# Patient Record
Sex: Female | Born: 1980 | Race: White | Hispanic: No | Marital: Married | Smoking: Former smoker
Health system: Southern US, Community
[De-identification: ages and names within clinical notes are randomized; demographics above are authoritative.]

## PROBLEM LIST (undated history)

## (undated) HISTORY — PX: ABLATION: SHX5711

---

## 2016-01-27 DIAGNOSIS — N393 Stress incontinence (female) (male): Secondary | ICD-10-CM | POA: Diagnosis not present

## 2016-01-27 DIAGNOSIS — N3000 Acute cystitis without hematuria: Secondary | ICD-10-CM | POA: Diagnosis not present

## 2016-01-27 DIAGNOSIS — N39492 Postural (urinary) incontinence: Secondary | ICD-10-CM | POA: Diagnosis not present

## 2016-01-27 DIAGNOSIS — N3941 Urge incontinence: Secondary | ICD-10-CM | POA: Diagnosis not present

## 2016-02-05 DIAGNOSIS — F418 Other specified anxiety disorders: Secondary | ICD-10-CM | POA: Diagnosis not present

## 2016-02-05 DIAGNOSIS — R05 Cough: Secondary | ICD-10-CM | POA: Diagnosis not present

## 2016-02-12 DIAGNOSIS — N39492 Postural (urinary) incontinence: Secondary | ICD-10-CM | POA: Diagnosis not present

## 2016-02-12 DIAGNOSIS — N393 Stress incontinence (female) (male): Secondary | ICD-10-CM | POA: Diagnosis not present

## 2016-02-12 DIAGNOSIS — N318 Other neuromuscular dysfunction of bladder: Secondary | ICD-10-CM | POA: Diagnosis not present

## 2016-02-12 DIAGNOSIS — N3020 Other chronic cystitis without hematuria: Secondary | ICD-10-CM | POA: Diagnosis not present

## 2016-02-12 DIAGNOSIS — N3941 Urge incontinence: Secondary | ICD-10-CM | POA: Diagnosis not present

## 2016-04-13 DIAGNOSIS — Z713 Dietary counseling and surveillance: Secondary | ICD-10-CM | POA: Diagnosis not present

## 2016-05-05 DIAGNOSIS — F418 Other specified anxiety disorders: Secondary | ICD-10-CM | POA: Diagnosis not present

## 2016-05-28 DIAGNOSIS — Z23 Encounter for immunization: Secondary | ICD-10-CM | POA: Diagnosis not present

## 2016-06-07 DIAGNOSIS — R6889 Other general symptoms and signs: Secondary | ICD-10-CM | POA: Diagnosis not present

## 2016-06-07 DIAGNOSIS — F319 Bipolar disorder, unspecified: Secondary | ICD-10-CM | POA: Diagnosis not present

## 2016-06-07 DIAGNOSIS — B354 Tinea corporis: Secondary | ICD-10-CM | POA: Diagnosis not present

## 2016-06-07 DIAGNOSIS — M25561 Pain in right knee: Secondary | ICD-10-CM | POA: Diagnosis not present

## 2016-07-06 DIAGNOSIS — F319 Bipolar disorder, unspecified: Secondary | ICD-10-CM | POA: Diagnosis not present

## 2016-07-06 DIAGNOSIS — Z720 Tobacco use: Secondary | ICD-10-CM | POA: Diagnosis not present

## 2016-09-10 DIAGNOSIS — Z1389 Encounter for screening for other disorder: Secondary | ICD-10-CM | POA: Diagnosis not present

## 2016-09-10 DIAGNOSIS — H6092 Unspecified otitis externa, left ear: Secondary | ICD-10-CM | POA: Diagnosis not present

## 2016-09-10 DIAGNOSIS — Z0000 Encounter for general adult medical examination without abnormal findings: Secondary | ICD-10-CM | POA: Diagnosis not present

## 2016-12-01 DIAGNOSIS — N3090 Cystitis, unspecified without hematuria: Secondary | ICD-10-CM | POA: Diagnosis not present

## 2016-12-01 DIAGNOSIS — R300 Dysuria: Secondary | ICD-10-CM | POA: Diagnosis not present

## 2017-01-17 DIAGNOSIS — R1011 Right upper quadrant pain: Secondary | ICD-10-CM | POA: Diagnosis not present

## 2017-01-17 DIAGNOSIS — R112 Nausea with vomiting, unspecified: Secondary | ICD-10-CM | POA: Diagnosis not present

## 2017-01-17 DIAGNOSIS — K2900 Acute gastritis without bleeding: Secondary | ICD-10-CM | POA: Diagnosis not present

## 2017-01-17 DIAGNOSIS — K2970 Gastritis, unspecified, without bleeding: Secondary | ICD-10-CM | POA: Diagnosis not present

## 2017-02-03 DIAGNOSIS — Z6835 Body mass index (BMI) 35.0-35.9, adult: Secondary | ICD-10-CM | POA: Diagnosis not present

## 2017-02-03 DIAGNOSIS — Z01419 Encounter for gynecological examination (general) (routine) without abnormal findings: Secondary | ICD-10-CM | POA: Diagnosis not present

## 2017-02-06 DIAGNOSIS — R51 Headache: Secondary | ICD-10-CM | POA: Diagnosis not present

## 2017-02-06 DIAGNOSIS — Z6835 Body mass index (BMI) 35.0-35.9, adult: Secondary | ICD-10-CM | POA: Diagnosis not present

## 2017-02-06 DIAGNOSIS — H53149 Visual discomfort, unspecified: Secondary | ICD-10-CM | POA: Diagnosis not present

## 2017-02-06 DIAGNOSIS — F17210 Nicotine dependence, cigarettes, uncomplicated: Secondary | ICD-10-CM | POA: Diagnosis not present

## 2017-02-06 DIAGNOSIS — R112 Nausea with vomiting, unspecified: Secondary | ICD-10-CM | POA: Diagnosis not present

## 2017-02-06 DIAGNOSIS — F419 Anxiety disorder, unspecified: Secondary | ICD-10-CM | POA: Diagnosis not present

## 2017-02-07 DIAGNOSIS — R51 Headache: Secondary | ICD-10-CM | POA: Diagnosis not present

## 2017-02-18 DIAGNOSIS — H60391 Other infective otitis externa, right ear: Secondary | ICD-10-CM | POA: Diagnosis not present

## 2017-02-23 DIAGNOSIS — N941 Unspecified dyspareunia: Secondary | ICD-10-CM | POA: Diagnosis not present

## 2017-02-23 DIAGNOSIS — N946 Dysmenorrhea, unspecified: Secondary | ICD-10-CM | POA: Diagnosis not present

## 2017-02-23 DIAGNOSIS — N924 Excessive bleeding in the premenopausal period: Secondary | ICD-10-CM | POA: Diagnosis not present

## 2017-02-23 DIAGNOSIS — N921 Excessive and frequent menstruation with irregular cycle: Secondary | ICD-10-CM | POA: Diagnosis not present

## 2017-05-03 DIAGNOSIS — N921 Excessive and frequent menstruation with irregular cycle: Secondary | ICD-10-CM | POA: Diagnosis not present

## 2017-05-03 DIAGNOSIS — N809 Endometriosis, unspecified: Secondary | ICD-10-CM | POA: Diagnosis not present

## 2017-05-05 DIAGNOSIS — N800 Endometriosis of uterus: Secondary | ICD-10-CM | POA: Diagnosis not present

## 2017-05-05 DIAGNOSIS — N920 Excessive and frequent menstruation with regular cycle: Secondary | ICD-10-CM | POA: Diagnosis not present

## 2017-05-10 DIAGNOSIS — G4710 Hypersomnia, unspecified: Secondary | ICD-10-CM | POA: Diagnosis not present

## 2017-05-10 DIAGNOSIS — F319 Bipolar disorder, unspecified: Secondary | ICD-10-CM | POA: Diagnosis not present

## 2017-05-10 DIAGNOSIS — Z6836 Body mass index (BMI) 36.0-36.9, adult: Secondary | ICD-10-CM | POA: Diagnosis not present

## 2017-05-20 DIAGNOSIS — Z23 Encounter for immunization: Secondary | ICD-10-CM | POA: Diagnosis not present

## 2017-05-23 DIAGNOSIS — Z09 Encounter for follow-up examination after completed treatment for conditions other than malignant neoplasm: Secondary | ICD-10-CM | POA: Diagnosis not present

## 2017-06-24 DIAGNOSIS — G4710 Hypersomnia, unspecified: Secondary | ICD-10-CM | POA: Diagnosis not present

## 2017-06-24 DIAGNOSIS — G4733 Obstructive sleep apnea (adult) (pediatric): Secondary | ICD-10-CM | POA: Diagnosis not present

## 2017-06-28 DIAGNOSIS — G4710 Hypersomnia, unspecified: Secondary | ICD-10-CM | POA: Diagnosis not present

## 2017-07-11 DIAGNOSIS — N3090 Cystitis, unspecified without hematuria: Secondary | ICD-10-CM | POA: Diagnosis not present

## 2017-07-11 DIAGNOSIS — R300 Dysuria: Secondary | ICD-10-CM | POA: Diagnosis not present

## 2017-07-24 DIAGNOSIS — G4733 Obstructive sleep apnea (adult) (pediatric): Secondary | ICD-10-CM | POA: Diagnosis not present

## 2017-07-24 DIAGNOSIS — G4710 Hypersomnia, unspecified: Secondary | ICD-10-CM | POA: Diagnosis not present

## 2017-08-10 DIAGNOSIS — J069 Acute upper respiratory infection, unspecified: Secondary | ICD-10-CM | POA: Diagnosis not present

## 2017-08-24 DIAGNOSIS — G4733 Obstructive sleep apnea (adult) (pediatric): Secondary | ICD-10-CM | POA: Diagnosis not present

## 2017-08-24 DIAGNOSIS — G4710 Hypersomnia, unspecified: Secondary | ICD-10-CM | POA: Diagnosis not present

## 2017-09-24 DIAGNOSIS — G4733 Obstructive sleep apnea (adult) (pediatric): Secondary | ICD-10-CM | POA: Diagnosis not present

## 2017-09-24 DIAGNOSIS — G4710 Hypersomnia, unspecified: Secondary | ICD-10-CM | POA: Diagnosis not present

## 2017-10-20 DIAGNOSIS — J209 Acute bronchitis, unspecified: Secondary | ICD-10-CM | POA: Diagnosis not present

## 2017-11-11 DIAGNOSIS — L409 Psoriasis, unspecified: Secondary | ICD-10-CM | POA: Diagnosis not present

## 2017-11-23 DIAGNOSIS — Z0000 Encounter for general adult medical examination without abnormal findings: Secondary | ICD-10-CM | POA: Diagnosis not present

## 2017-12-12 DIAGNOSIS — R7301 Impaired fasting glucose: Secondary | ICD-10-CM | POA: Diagnosis not present

## 2018-01-03 DIAGNOSIS — B360 Pityriasis versicolor: Secondary | ICD-10-CM | POA: Diagnosis not present

## 2018-01-03 DIAGNOSIS — L299 Pruritus, unspecified: Secondary | ICD-10-CM | POA: Diagnosis not present

## 2018-01-03 DIAGNOSIS — L301 Dyshidrosis [pompholyx]: Secondary | ICD-10-CM | POA: Diagnosis not present

## 2018-02-14 DIAGNOSIS — Z6837 Body mass index (BMI) 37.0-37.9, adult: Secondary | ICD-10-CM | POA: Diagnosis not present

## 2018-02-14 DIAGNOSIS — Z01419 Encounter for gynecological examination (general) (routine) without abnormal findings: Secondary | ICD-10-CM | POA: Diagnosis not present

## 2018-03-13 DIAGNOSIS — N3090 Cystitis, unspecified without hematuria: Secondary | ICD-10-CM | POA: Diagnosis not present

## 2018-03-13 DIAGNOSIS — R300 Dysuria: Secondary | ICD-10-CM | POA: Diagnosis not present

## 2018-04-06 DIAGNOSIS — F419 Anxiety disorder, unspecified: Secondary | ICD-10-CM | POA: Diagnosis not present

## 2018-04-06 DIAGNOSIS — F319 Bipolar disorder, unspecified: Secondary | ICD-10-CM | POA: Diagnosis not present

## 2018-04-06 DIAGNOSIS — R002 Palpitations: Secondary | ICD-10-CM | POA: Diagnosis not present

## 2018-04-06 DIAGNOSIS — Z1339 Encounter for screening examination for other mental health and behavioral disorders: Secondary | ICD-10-CM | POA: Diagnosis not present

## 2018-05-17 DIAGNOSIS — Z23 Encounter for immunization: Secondary | ICD-10-CM | POA: Diagnosis not present

## 2018-05-22 DIAGNOSIS — R5381 Other malaise: Secondary | ICD-10-CM | POA: Diagnosis not present

## 2018-05-22 DIAGNOSIS — I10 Essential (primary) hypertension: Secondary | ICD-10-CM | POA: Diagnosis not present

## 2018-05-28 DIAGNOSIS — H6092 Unspecified otitis externa, left ear: Secondary | ICD-10-CM | POA: Diagnosis not present

## 2018-06-06 DIAGNOSIS — Z713 Dietary counseling and surveillance: Secondary | ICD-10-CM | POA: Diagnosis not present

## 2018-06-19 DIAGNOSIS — E6609 Other obesity due to excess calories: Secondary | ICD-10-CM | POA: Diagnosis not present

## 2018-07-28 DIAGNOSIS — E6609 Other obesity due to excess calories: Secondary | ICD-10-CM | POA: Diagnosis not present

## 2018-07-28 DIAGNOSIS — Z713 Dietary counseling and surveillance: Secondary | ICD-10-CM | POA: Diagnosis not present

## 2018-07-28 DIAGNOSIS — I10 Essential (primary) hypertension: Secondary | ICD-10-CM | POA: Diagnosis not present

## 2018-08-11 DIAGNOSIS — L301 Dyshidrosis [pompholyx]: Secondary | ICD-10-CM | POA: Diagnosis not present

## 2018-08-11 DIAGNOSIS — L219 Seborrheic dermatitis, unspecified: Secondary | ICD-10-CM | POA: Diagnosis not present

## 2018-08-28 DIAGNOSIS — I10 Essential (primary) hypertension: Secondary | ICD-10-CM | POA: Diagnosis not present

## 2018-09-25 DIAGNOSIS — Z713 Dietary counseling and surveillance: Secondary | ICD-10-CM | POA: Diagnosis not present

## 2018-10-24 DIAGNOSIS — R5382 Chronic fatigue, unspecified: Secondary | ICD-10-CM | POA: Diagnosis not present

## 2018-11-02 DIAGNOSIS — G43909 Migraine, unspecified, not intractable, without status migrainosus: Secondary | ICD-10-CM | POA: Diagnosis not present

## 2018-11-02 DIAGNOSIS — Z6832 Body mass index (BMI) 32.0-32.9, adult: Secondary | ICD-10-CM | POA: Diagnosis not present

## 2018-12-13 DIAGNOSIS — Z713 Dietary counseling and surveillance: Secondary | ICD-10-CM | POA: Diagnosis not present

## 2019-02-01 DIAGNOSIS — R5382 Chronic fatigue, unspecified: Secondary | ICD-10-CM | POA: Diagnosis not present

## 2019-02-22 DIAGNOSIS — Z87891 Personal history of nicotine dependence: Secondary | ICD-10-CM | POA: Diagnosis not present

## 2019-02-22 DIAGNOSIS — Z6832 Body mass index (BMI) 32.0-32.9, adult: Secondary | ICD-10-CM | POA: Diagnosis not present

## 2019-02-22 DIAGNOSIS — R110 Nausea: Secondary | ICD-10-CM | POA: Diagnosis not present

## 2019-02-22 DIAGNOSIS — R1011 Right upper quadrant pain: Secondary | ICD-10-CM | POA: Diagnosis not present

## 2019-02-22 DIAGNOSIS — R140 Abdominal distension (gaseous): Secondary | ICD-10-CM | POA: Diagnosis not present

## 2019-02-22 DIAGNOSIS — R109 Unspecified abdominal pain: Secondary | ICD-10-CM | POA: Diagnosis not present

## 2019-02-26 DIAGNOSIS — B373 Candidiasis of vulva and vagina: Secondary | ICD-10-CM | POA: Diagnosis not present

## 2019-02-26 DIAGNOSIS — Z713 Dietary counseling and surveillance: Secondary | ICD-10-CM | POA: Diagnosis not present

## 2019-02-26 DIAGNOSIS — Z6832 Body mass index (BMI) 32.0-32.9, adult: Secondary | ICD-10-CM | POA: Diagnosis not present

## 2019-02-26 DIAGNOSIS — Z01419 Encounter for gynecological examination (general) (routine) without abnormal findings: Secondary | ICD-10-CM | POA: Diagnosis not present

## 2019-03-08 DIAGNOSIS — R1011 Right upper quadrant pain: Secondary | ICD-10-CM | POA: Diagnosis not present

## 2019-03-26 DIAGNOSIS — R5382 Chronic fatigue, unspecified: Secondary | ICD-10-CM | POA: Diagnosis not present

## 2019-04-26 DIAGNOSIS — Z713 Dietary counseling and surveillance: Secondary | ICD-10-CM | POA: Diagnosis not present

## 2019-06-20 DIAGNOSIS — R5382 Chronic fatigue, unspecified: Secondary | ICD-10-CM | POA: Diagnosis not present

## 2019-06-20 DIAGNOSIS — Z23 Encounter for immunization: Secondary | ICD-10-CM | POA: Diagnosis not present

## 2019-07-07 DIAGNOSIS — N3091 Cystitis, unspecified with hematuria: Secondary | ICD-10-CM | POA: Diagnosis not present

## 2019-07-18 DIAGNOSIS — R5382 Chronic fatigue, unspecified: Secondary | ICD-10-CM | POA: Diagnosis not present

## 2020-01-09 DIAGNOSIS — F419 Anxiety disorder, unspecified: Secondary | ICD-10-CM | POA: Insufficient documentation

## 2020-01-09 DIAGNOSIS — E559 Vitamin D deficiency, unspecified: Secondary | ICD-10-CM | POA: Insufficient documentation

## 2020-01-09 DIAGNOSIS — E785 Hyperlipidemia, unspecified: Secondary | ICD-10-CM | POA: Insufficient documentation

## 2020-01-09 DIAGNOSIS — Z6832 Body mass index (BMI) 32.0-32.9, adult: Secondary | ICD-10-CM | POA: Insufficient documentation

## 2020-01-09 DIAGNOSIS — E538 Deficiency of other specified B group vitamins: Secondary | ICD-10-CM | POA: Insufficient documentation

## 2020-01-09 DIAGNOSIS — R7303 Prediabetes: Secondary | ICD-10-CM | POA: Insufficient documentation

## 2020-08-20 DIAGNOSIS — R209 Unspecified disturbances of skin sensation: Secondary | ICD-10-CM | POA: Diagnosis not present

## 2020-08-20 DIAGNOSIS — L659 Nonscarring hair loss, unspecified: Secondary | ICD-10-CM | POA: Diagnosis not present

## 2020-08-20 DIAGNOSIS — R5383 Other fatigue: Secondary | ICD-10-CM | POA: Diagnosis not present

## 2020-08-20 DIAGNOSIS — N3090 Cystitis, unspecified without hematuria: Secondary | ICD-10-CM | POA: Diagnosis not present

## 2020-10-26 NOTE — Progress Notes (Signed)
Office Visit Note  Patient: Katherine Zhang             Date of Birth: Aug 11, 1980           MRN: [id number]             PCP: Bess Harvest, PA-C Referring: Bess Harvest, PA-C Visit Date: 10/27/2020   Subjective:  New Patient (Initial Visit) (Abnormal labs. Patient has not had COVID vaccines)   History of Present Illness: Katherine Zhang is a 40 y.o. female here for evaluation of positive ANA with fatigue, alopecia, extremity discoloration, rashes, muscle spasms, and back and wrist pain. She estimated symptoms have been around about 2 years but with worsening recently. This was after COVID which she feels is contributory. She is taking Vyvanse for weight loss since around 6-12 months ago previously on adipex and otherwise no recent medication change. The most specific complaint issue is very cold, discolored, and sometimes painful extremities. She notices purple or white discoloration of her toes. Her fingers do not change color but also become extremely cold. She denies any ulcers or blistering with these. She does not take anything specifically for this. She does also have the somewhat generalized joint pains without focal areas of swelling, redness, or warmth. She feels generalized hair thinning but no scalp rash no bald spots no areas of recession.  Labs reviewed 09/2020 Results not available: Intrinsic factor blocking Abs MMA B12 and folate panel  08/2020 ANA 1:80 speckled RF neg SSA, SSB, Sm, RNP, scl-70, ribosomal P, reticulin, AMA, SMA, dsDNA neg TPO AB neg Gastric parietal cell Ab 134.5 (pos)   Activities of Daily Living:  Patient reports morning stiffness for 2 hours.   Patient Denies nocturnal pain.  Difficulty dressing/grooming: Denies Difficulty climbing stairs: Denies Difficulty getting out of chair: Denies Difficulty using hands for taps, buttons, cutlery, and/or writing: Denies  Review of Systems  Constitutional: Positive for fatigue.  HENT:  Negative for mouth dryness.   Eyes: Negative for dryness.  Respiratory: Positive for shortness of breath.   Cardiovascular: Positive for swelling in legs/feet.  Gastrointestinal: Positive for constipation.  Endocrine: Positive for cold intolerance and increased urination.  Genitourinary: Negative for difficulty urinating.  Musculoskeletal: Positive for arthralgias, gait problem, joint pain, joint swelling, morning stiffness and muscle tenderness.  Skin: Positive for rash.  Allergic/Immunologic: Negative for susceptible to infections.  Neurological: Negative for numbness.  Hematological: Positive for bruising/bleeding tendency.  Psychiatric/Behavioral: Positive for sleep disturbance.    PMFS History:  Patient Active Problem List   Diagnosis Date Noted  . Binge eating disorder 10/27/2020  . Chronic fatigue syndrome 10/27/2020  . Essential hypertension 10/27/2020  . Raynaud's phenomenon without gangrene 10/27/2020  . 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 Positive ANA (antinuclear antibody) 10/27/2020  . Anxiety 01/09/2020  . BMI 32.0-32.9,adult 01/09/2020  . Dyslipidemia (high LDL; low HDL) 01/09/2020  . Pre-diabetes 01/09/2020  . Vitamin B 12 deficiency 01/09/2020  . Vitamin D deficiency 01/09/2020    History reviewed. No pertinent past medical history.  Family History  Problem Relation Age of Onset  . Kidney cancer Mother   . Lung cancer Mother   . Lung cancer Father    Past Surgical History:  Procedure Laterality Date  . ABLATION    . CESAREAN SECTION     Social History   Social History Narrative  . Not on file   Immunization History  Administered Date(s) Administered  . Influenza, Quadrivalent, Recombinant, Inj, Pf 06/20/2019  .  Influenza,inj,Quad PF,6+ Mos 05/13/2020     Objective: Vital Signs: BP 134/83 (BP Location: Right Arm, Patient Position: Sitting, Cuff Size: Normal)   Pulse (!) 104   Resp 15   Ht 5\' 6"  (1.676 m)   Wt 200 lb (90.7 kg)   BMI 32.28 kg/m     Physical Exam Constitutional:      Appearance: She is obese.  HENT:     Right Ear: External ear normal.     Left Ear: External ear normal.     Mouth/Throat:     Mouth: Mucous membranes are moist.     Pharynx: Oropharynx is clear.  Eyes:     Conjunctiva/sclera: Conjunctivae normal.  Cardiovascular:     Rate and Rhythm: Regular rhythm. Tachycardia present.  Pulmonary:     Effort: Pulmonary effort is normal.     Breath sounds: Normal breath sounds.  Skin:    General: Skin is warm and dry.     Comments: Livedo on inner arms Erythema and peeling at distal fingers, no pitting or ulceration Several violaceous toes, blanchable with normal capillary refill Normal nailfold capillaroscopy  Neurological:     General: No focal deficit present.     Mental Status: She is alert.  Psychiatric:        Mood and Affect: Mood normal.     Musculoskeletal Exam:  Neck full ROM no tenderness Shoulders full ROM no tenderness or swelling Elbows full ROM no tenderness or swelling Wrists full ROM no tenderness or swelling Fingers full ROM no tenderness or swelling Knees full ROM no tenderness or swelling Ankles hypermobile with increased inversion and eversion ROM, no swelling MTPs hypermobile with increased dorsiflexion of multiple toes, no swleling or tenderness, mildly cocked up toe deformity   Investigation: No additional findings.  Imaging: No results found.  Recent Labs: No results found for: WBC, HGB, PLT, NA, K, CL, CO2, GLUCOSE, BUN, CREATININE, BILITOT, ALKPHOS, AST, ALT, PROT, ALBUMIN, CALCIUM, GFRAA, QFTBGOLD, QFTBGOLDPLUS  Speciality Comments: No specialty comments available.  Procedures:  No procedures performed Allergies: Patient has no allergy information on record.   Assessment / Plan:     Visit Diagnoses: Positive ANA (antinuclear antibody) - Plan: Lupus Anticoagulant Eval w/Reflex, Beta-2 glycoprotein antibodies, Cardiolipin antibodies, IgG, IgM, IgA,  Urinalysis  Positive ANA serology but does not present clinical features of SLE at this time. She has raynaud's phenomenon and diffuse livedoid rash on upper and lower extremities. She had one pregnancy complicated by HELLP syndrome and apparently negative APS Ab testing at that time, and a second pregnancy without complication. Checking APS Abs also checking UA to screen for nephritis involvement.  Raynaud's phenomenon without gangrene - Plan: Lupus Anticoagulant Eval w/Reflex, Beta-2 glycoprotein antibodies, Cardiolipin antibodies, IgG, IgM, IgA, Urinalysis, amLODipine (NORVASC) 5 MG tablet  Raynaud's without ischemic lesions with positive ANA but no abnormal nailfold capillaroscopy. This could be exacerbated by current sympathomimetic treatment for weight loss. Recommend starting amlodipine 5 mg PO daily and she may also try coming off current Vyvanse treatment.  Livedo reticularis - Plan: Lupus Anticoagulant Eval w/Reflex, Beta-2 glycoprotein antibodies, Cardiolipin antibodies, IgG, IgM, IgA, Urinalysis  Will recheck APS testing at this time since 8 yrs later. No history of blood clots or stroke, no racemosa lesions.  Orders: Orders Placed This Encounter  Procedures  . Lupus Anticoagulant Eval w/Reflex  . Beta-2 glycoprotein antibodies  . Cardiolipin antibodies, IgG, IgM, IgA  . Urinalysis   Meds ordered this encounter  Medications  . amLODipine (NORVASC) 5  MG tablet    Sig: Take 1 tablet (5 mg total) by mouth daily.    Dispense:  30 tablet    Refill:  0     Follow-Up Instructions: Return in about 3 weeks (around 11/17/2020) for New pt, raynaud's phenomenon [facility] start f/u.   Fuller Plan, MD  Note - This record has been created using [employer].  Chart creation errors have been sought, but may not always  have been located. Such creation errors do not reflect on  the standard of medical care.

## 2020-10-27 ENCOUNTER — Other Ambulatory Visit: Payer: Self-pay

## 2020-10-27 ENCOUNTER — Encounter: Payer: Self-pay | Admitting: Internal Medicine

## 2020-10-27 ENCOUNTER — Ambulatory Visit: Payer: BC Managed Care – PPO | Admitting: Internal Medicine

## 2020-10-27 VITALS — BP 134/83 | HR 104 | Resp 15 | Ht 66.0 in | Wt 200.0 lb

## 2020-10-27 DIAGNOSIS — R768 Other specified abnormal immunological findings in serum: Secondary | ICD-10-CM

## 2020-10-27 DIAGNOSIS — G9332 Myalgic encephalomyelitis/chronic fatigue syndrome: Secondary | ICD-10-CM | POA: Insufficient documentation

## 2020-10-27 DIAGNOSIS — R5382 Chronic fatigue, unspecified: Secondary | ICD-10-CM | POA: Insufficient documentation

## 2020-10-27 DIAGNOSIS — I7300 Raynaud's syndrome without gangrene: Secondary | ICD-10-CM | POA: Diagnosis not present

## 2020-10-27 DIAGNOSIS — R231 Pallor: Secondary | ICD-10-CM

## 2020-10-27 DIAGNOSIS — F50819 Binge eating disorder, unspecified: Secondary | ICD-10-CM | POA: Insufficient documentation

## 2020-10-27 DIAGNOSIS — I10 Essential (primary) hypertension: Secondary | ICD-10-CM | POA: Insufficient documentation

## 2020-10-27 DIAGNOSIS — F5081 Binge eating disorder: Secondary | ICD-10-CM | POA: Insufficient documentation

## 2020-10-27 NOTE — Patient Instructions (Signed)
Amlodipine Tablets What is this medicine? AMLODIPINE (am LOE di peen) is a calcium channel blocker. It relaxes your blood vessels and decreases the amount of work the heart has to do. It treats high blood pressure and/or prevents chest pain (also called angina). This medicine may be used for other purposes; ask your health care provider or pharmacist if you have questions. COMMON BRAND NAME(S): Norvasc What should I tell my health care provider before I take this medicine? They need to know if you have any of these conditions:  heart disease  liver disease  an unusual or allergic reaction to amlodipine, other drugs, foods, dyes, or preservatives  pregnant or trying to get pregnant  breast-feeding How should I use this medicine? Take this medicine by mouth. Take it as directed on the prescription label at the same time every day. You can take it with or without food. If it upsets your stomach, take it with food. Keep taking it unless your health care provider tells you to stop. Talk to your health care provider about the use of this medicine in children. While it may be prescribed for children as young as 6 for selected conditions, precautions do apply. Overdosage: If you think you have taken too much of this medicine contact a poison control center or emergency room at once. NOTE: This medicine is only for you. Do not share this medicine with others. What if I miss a dose? If you miss a dose, take it as soon as you can. If it is almost time for your next dose, take only that dose. Do not take double or extra doses. What may interact with this medicine? This medicine may interact with the following medications:  clarithromycin  cyclosporine  diltiazem  itraconazole  simvastatin  tacrolimus This list may not describe all possible interactions. Give your health care provider a list of all the medicines, herbs, non-prescription drugs, or dietary supplements you use. Also tell them  if you smoke, drink alcohol, or use illegal drugs. Some items may interact with your medicine. What should I watch for while using this medicine? Visit your health care provider for regular checks on your progress. Check your blood pressure as directed. Ask your health care provider what your blood pressure should be. Also, find out when you should contact him or her. Do not treat yourself for coughs, colds, or pain while you are using this medicine without asking your health care provider for advice. Some medicines may increase your blood pressure. You may get drowsy or dizzy. Do not drive, use machinery, or do anything that needs mental alertness until you know how this medicine affects you. Do not stand up or sit up quickly, especially if you are an older patient. This reduces the risk of dizzy or fainting spells. Alcohol can make you more drowsy and dizzy. Avoid alcoholic drinks. What side effects may I notice from receiving this medicine? Side effects that you should report to your doctor or health care provider as soon as possible:  allergic reactions (skin rash, itching or hives; swelling of the face, lips, or tongue)  heart attack (trouble breathing; pain or tightness in the chest, neck, back or arms; unusually weak or tired)  low blood pressure (dizziness; feeling faint or lightheaded, falls; unusually weak or tired) Side effects that usually do not require medical attention (report these to your doctor or health care provider if they continue or are bothersome):  facial flushing  nausea  palpitations  stomach pain  sudden weight gain  swelling of the ankles, feet, hands This list may not describe all possible side effects. Call your doctor for medical advice about side effects. You may report side effects to FDA at 1-800-FDA-1088. Where should I keep my medicine? Keep out of the reach of children and pets. Store at room temperature between 20 and 25 degrees C (68 and 77 degrees  F). Protect from light and moisture. Keep the container tightly closed. Get rid of any unused medicine after the expiration date. To get rid of medicines that are no longer needed or have expired:  Take the medicine to a medicine take-back program. Check with your pharmacy or law enforcement to find a location.  If you cannot return the medicine, check the label or package insert to see if the medicine should be thrown out in the garbage or flushed down the toilet. If you are not sure, ask your health care provider. If it is safe to put in the trash, empty the medicine out of the container. Mix the medicine with cat litter, dirt, coffee grounds, or other unwanted substance. Seal the mixture in a bag or container. Put it in the trash.    Raynaud Phenomenon  Raynaud phenomenon is a condition that affects the blood vessels (arteries) that carry blood to your fingers and toes. The arteries that supply blood to your ears, lips, nipples, or the tip of your nose might also be affected. Raynaud phenomenon causes the arteries to become narrow temporarily (spasm). As a result, the flow of blood to the affected areas is temporarily decreased. This usually occurs in response to cold temperatures or stress. During an attack, the skin in the affected areas turns white, then blue, and finally red. You may also feel tingling or numbness in those areas. Attacks usually last for only a brief period, and then the blood flow to the area returns to normal. In most cases, Raynaud phenomenon does not cause serious health problems. What are the causes? In many cases, the cause of this condition is not known. The condition may occur on its own (primary Raynaud phenomenon) or may be associated with other diseases or factors (secondary Raynaud phenomenon). Possible causes may include:  Diseases or medical conditions that damage the arteries.  Injuries and repetitive actions that hurt the hands or feet.  Being exposed to  certain chemicals.  Taking medicines that narrow the arteries.  Other medical conditions, such as lupus, scleroderma, rheumatoid arthritis, thyroid problems, blood disorders, Sjogren syndrome, or atherosclerosis. What increases the risk? The following factors may make you more likely to develop this condition:  Being 68-23 years old.  Being female.  Having a family history of Raynaud phenomenon.  Living in a cold climate.  Smoking. What are the signs or symptoms? Symptoms of this condition usually occur when you are exposed to cold temperatures or when you have emotional stress. The symptoms may last for a few minutes or up to several hours. They usually affect your fingers but may also affect your toes, nipples, lips, ears, or the tip of your nose. Symptoms may include:  Changes in skin color. The skin in the affected areas will turn pale or white. The skin may then change from white to bluish to red as normal blood flow returns to the area.  Numbness, tingling, or pain in the affected areas. In severe cases, symptoms may include:  Skin sores.  Tissues decaying and dying (gangrene). How is this diagnosed? This condition may be diagnosed  based on:  Your symptoms and medical history.  A physical exam. During the exam, you may be asked to put your hands in cold water to check for a reaction to cold temperature.  Tests, such as: ? Blood tests to check for other diseases or conditions. ? A test to check the movement of blood through your arteries and veins (vascular ultrasound). ? A test in which the skin at the base of your fingernail is examined under a microscope (nailfold capillaroscopy). How is this treated? Treatment for this condition often involves making lifestyle changes and taking steps to control your exposure to cold temperatures. For more severe cases, medicine (calcium channel blockers) may be used to improve blood flow. Surgery is sometimes done to block the nerves  that control the affected arteries, but this is rare. Follow these instructions at home: Avoiding cold temperatures Take these steps to avoid exposure to cold:  If possible, stay indoors during cold weather.  When you go outside during cold weather, dress in layers and wear mittens, a hat, a scarf, and warm footwear.  Wear mittens or gloves when handling ice or frozen food.  Use holders for glasses or cans containing cold drinks.  Let warm water run for a while before taking a shower or bath.  Warm up the car before driving in cold weather. Lifestyle  If possible, avoid stressful and emotional situations. Try to find ways to manage your stress, such as: ? Exercise. ? Yoga. ? Meditation. ? Biofeedback.  Do not use any products that contain nicotine or tobacco, such as cigarettes and e-cigarettes. If you need help quitting, ask your health care provider.  Avoid secondhand smoke.  Limit your use of caffeine. ? Switch to decaffeinated coffee, tea, and soda. ? Avoid chocolate.  Avoid vibrating tools and machinery. General instructions  Protect your hands and feet from injuries, cuts, or bruises.  Avoid wearing tight rings or wristbands.  Wear loose fitting socks and comfortable, roomy shoes.  Take over-the-counter and prescription medicines only as told by your health care provider. Contact a health care provider if:  Your discomfort becomes worse despite lifestyle changes.  You develop sores on your fingers or toes that do not heal.  Your fingers or toes turn black.  You have breaks in the skin on your fingers or toes.  You have a fever.  You have pain or swelling in your joints.  You have a rash.  Your symptoms occur on only one side of your body. Summary  Raynaud phenomenon is a condition that affects the arteries that carry blood to your fingers, toes, ears, lips, nipples, or the tip of your nose.  In many cases, the cause of this condition is not  known.  Symptoms of this condition include changes in skin color, and numbness and tingling of the affected area.  Treatment for this condition includes lifestyle changes, reducing exposure to cold temperatures, and using medicines for severe cases of the condition.  Contact your health care provider if your condition worsens despite treatment.

## 2020-10-28 ENCOUNTER — Other Ambulatory Visit: Payer: Self-pay | Admitting: Internal Medicine

## 2020-10-28 ENCOUNTER — Telehealth: Payer: Self-pay

## 2020-10-28 DIAGNOSIS — I7300 Raynaud's syndrome without gangrene: Secondary | ICD-10-CM

## 2020-10-28 MED ORDER — AMLODIPINE BESYLATE 5 MG PO TABS: 5.0000 mg | ORAL_TABLET | Freq: Every day | ORAL | 0 refills | Status: AC

## 2020-10-28 NOTE — Telephone Encounter (Signed)
I had not actually sent it yet, a mistake on my part. Rx is now sent to pharmacy.

## 2020-10-28 NOTE — Telephone Encounter (Signed)
Patient states at her appointment yesterday Dr. Dimple Casey gave her information regarding Amlodipine.  Patient requested a return call to let her know if it was sent to the pharmacy.

## 2020-10-28 NOTE — Telephone Encounter (Signed)
Spoke with patient, advised Rx has been sent to pharmacy.

## 2020-10-28 NOTE — Telephone Encounter (Signed)
Don't see where Rx was sent to pharmacy? Please advise.

## 2020-10-29 LAB — URINALYSIS
Bilirubin Urine: NEGATIVE
Glucose, UA: NEGATIVE
Hgb urine dipstick: NEGATIVE
Ketones, ur: NEGATIVE
Nitrite: NEGATIVE
Protein, ur: NEGATIVE
Specific Gravity, Urine: 1.024 (ref 1.001–1.03)
pH: 6.5 (ref 5.0–8.0)

## 2020-10-29 LAB — CARDIOLIPIN ANTIBODIES, IGG, IGM, IGA
Anticardiolipin IgA: <2 APL-U/mL
Anticardiolipin IgG: <2 GPL-U/mL
Anticardiolipin IgM: <2 MPL-U/mL

## 2020-10-29 LAB — LUPUS ANTICOAGULANT EVAL W/ REFLEX
PTT-LA Screen: 31 s (ref <=40)
dRVVT: 31 s (ref <=45)

## 2020-10-29 LAB — BETA-2 GLYCOPROTEIN ANTIBODIES
Beta-2 Glyco 1 IgA: <2 U/mL
Beta-2 Glyco 1 IgM: <2 U/mL
Beta-2 Glyco I IgG: <2 U/mL

## 2020-10-31 NOTE — Progress Notes (Signed)
Tests for the antiphospholipid antibodies are negative. This does not show any underlying problem with clotting related to her circulation problems. The urinalysis shows a few white blood cells but otherwise normal and with no bacteria. I do not think that requires follow up unless she notices urinary symptoms.

## 2020-11-03 DIAGNOSIS — I7300 Raynaud's syndrome without gangrene: Secondary | ICD-10-CM | POA: Diagnosis not present

## 2020-11-04 DIAGNOSIS — J069 Acute upper respiratory infection, unspecified: Secondary | ICD-10-CM | POA: Diagnosis not present

## 2020-11-18 ENCOUNTER — Ambulatory Visit: Payer: BC Managed Care – PPO | Admitting: Internal Medicine

## 2020-11-18 NOTE — Progress Notes (Deleted)
   Office Visit Note  Patient: Katherine Zhang             Date of Birth: [birth year]           MRN: [id number]             PCP: Bess Harvest, PA-C Referring: Bess Harvest, PA-C Visit Date: 11/18/2020   Subjective:  No chief complaint on file.   History of Present Illness: Katherine Zhang is a 40 y.o. female here for follow up ***     No Rheumatology ROS completed.   PMFS History:  Patient Active Problem List   Diagnosis Date Noted  . Binge eating disorder 10/27/2020  . Chronic fatigue syndrome 10/27/2020  . Essential hypertension 10/27/2020  . Raynaud's phenomenon without gangrene 10/27/2020  . 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 Positive ANA (antinuclear antibody) 10/27/2020  . Anxiety 01/09/2020  . BMI 32.0-32.9,adult 01/09/2020  . Dyslipidemia (high LDL; low HDL) 01/09/2020  . Pre-diabetes 01/09/2020  . Vitamin B 12 deficiency 01/09/2020  . Vitamin D deficiency 01/09/2020    No past medical history on file.  Family History  Problem Relation Age of Onset  . Kidney cancer Mother   . Lung cancer Mother   . Lung cancer Father    Past Surgical History:  Procedure Laterality Date  . ABLATION    . CESAREAN SECTION     Social History   Social History Narrative  . Not on file   Immunization History  Administered Date(s) Administered  . Influenza, Quadrivalent, Recombinant, Inj, Pf 06/20/2019  . Influenza,inj,Quad PF,6+ Mos 05/13/2020     Objective: Vital Signs: There were no vitals taken for this visit.   Physical Exam   Musculoskeletal Exam: ***  CDAI Exam: CDAI Score: -- Patient Global: --; Provider Global: -- Swollen: --; Tender: -- Joint Exam 11/18/2020   No joint exam has been documented for this visit   There is currently no information documented on the homunculus. Go to the Rheumatology activity and complete the homunculus joint exam.  Investigation: No additional findings.  Imaging: No results found.  Recent Labs: No  results found for: WBC, HGB, PLT, NA, K, CL, CO2, GLUCOSE, BUN, CREATININE, BILITOT, ALKPHOS, AST, ALT, PROT, ALBUMIN, CALCIUM, GFRAA, QFTBGOLD, QFTBGOLDPLUS  Speciality Comments: No specialty comments available.  Procedures:  No procedures performed Allergies: Patient has no allergy information on record.   Assessment / Plan:     Visit Diagnoses: No diagnosis found.  ***  Orders: No orders of the defined types were placed in this encounter.  No orders of the defined types were placed in this encounter.    Follow-Up Instructions: No follow-ups on file.   Fuller Plan, MD  Note - This record has been created using [employer].  Chart creation errors have been sought, but may not always  have been located. Such creation errors do not reflect on  the standard of medical care.

## 2021-02-03 DIAGNOSIS — I7300 Raynaud's syndrome without gangrene: Secondary | ICD-10-CM | POA: Diagnosis not present

## 2021-02-03 DIAGNOSIS — Z0000 Encounter for general adult medical examination without abnormal findings: Secondary | ICD-10-CM | POA: Diagnosis not present

## 2021-02-03 DIAGNOSIS — R5382 Chronic fatigue, unspecified: Secondary | ICD-10-CM | POA: Diagnosis not present

## 2021-03-30 DIAGNOSIS — M7989 Other specified soft tissue disorders: Secondary | ICD-10-CM | POA: Diagnosis not present

## 2021-03-31 ENCOUNTER — Other Ambulatory Visit: Payer: Self-pay | Admitting: Family Medicine

## 2021-03-31 ENCOUNTER — Ambulatory Visit
Admission: RE | Admit: 2021-03-31 | Discharge: 2021-03-31 | Disposition: A | Discharge status: Home / Self Care | Payer: BC Managed Care – PPO | Source: Ambulatory Visit | Attending: Family Medicine | Admitting: Family Medicine

## 2021-03-31 ENCOUNTER — Other Ambulatory Visit: Payer: Self-pay

## 2021-03-31 DIAGNOSIS — M7989 Other specified soft tissue disorders: Secondary | ICD-10-CM

## 2021-03-31 DIAGNOSIS — M79641 Pain in right hand: Secondary | ICD-10-CM | POA: Diagnosis not present

## 2021-04-22 DIAGNOSIS — N760 Acute vaginitis: Secondary | ICD-10-CM | POA: Diagnosis not present

## 2021-05-06 DIAGNOSIS — R5382 Chronic fatigue, unspecified: Secondary | ICD-10-CM | POA: Diagnosis not present

## 2021-05-06 DIAGNOSIS — E782 Mixed hyperlipidemia: Secondary | ICD-10-CM | POA: Diagnosis not present

## 2021-05-06 DIAGNOSIS — I7300 Raynaud's syndrome without gangrene: Secondary | ICD-10-CM | POA: Diagnosis not present

## 2021-05-06 DIAGNOSIS — E559 Vitamin D deficiency, unspecified: Secondary | ICD-10-CM | POA: Diagnosis not present

## 2021-05-06 DIAGNOSIS — D518 Other vitamin B12 deficiency anemias: Secondary | ICD-10-CM | POA: Diagnosis not present

## 2021-05-06 DIAGNOSIS — I10 Essential (primary) hypertension: Secondary | ICD-10-CM | POA: Diagnosis not present

## 2021-05-06 DIAGNOSIS — E119 Type 2 diabetes mellitus without complications: Secondary | ICD-10-CM | POA: Diagnosis not present

## 2021-05-06 DIAGNOSIS — E038 Other specified hypothyroidism: Secondary | ICD-10-CM | POA: Diagnosis not present

## 2021-06-25 DIAGNOSIS — J40 Bronchitis, not specified as acute or chronic: Secondary | ICD-10-CM | POA: Diagnosis not present

## 2021-06-25 DIAGNOSIS — J0100 Acute maxillary sinusitis, unspecified: Secondary | ICD-10-CM | POA: Diagnosis not present

## 2021-08-05 DIAGNOSIS — R5382 Chronic fatigue, unspecified: Secondary | ICD-10-CM | POA: Diagnosis not present

## 2021-08-05 DIAGNOSIS — R519 Headache, unspecified: Secondary | ICD-10-CM | POA: Diagnosis not present

## 2021-09-19 DIAGNOSIS — F5081 Binge eating disorder: Secondary | ICD-10-CM | POA: Diagnosis not present

## 2021-09-28 DIAGNOSIS — F319 Bipolar disorder, unspecified: Secondary | ICD-10-CM | POA: Diagnosis not present

## 2021-09-28 DIAGNOSIS — Z6831 Body mass index (BMI) 31.0-31.9, adult: Secondary | ICD-10-CM | POA: Diagnosis not present

## 2021-09-28 DIAGNOSIS — R6889 Other general symptoms and signs: Secondary | ICD-10-CM | POA: Diagnosis not present

## 2021-10-25 DIAGNOSIS — F5081 Binge eating disorder: Secondary | ICD-10-CM | POA: Diagnosis not present

## 2021-11-03 DIAGNOSIS — E782 Mixed hyperlipidemia: Secondary | ICD-10-CM | POA: Diagnosis not present

## 2021-11-03 DIAGNOSIS — D518 Other vitamin B12 deficiency anemias: Secondary | ICD-10-CM | POA: Diagnosis not present

## 2021-11-03 DIAGNOSIS — E038 Other specified hypothyroidism: Secondary | ICD-10-CM | POA: Diagnosis not present

## 2021-11-03 DIAGNOSIS — E559 Vitamin D deficiency, unspecified: Secondary | ICD-10-CM | POA: Diagnosis not present

## 2021-11-03 DIAGNOSIS — E119 Type 2 diabetes mellitus without complications: Secondary | ICD-10-CM | POA: Diagnosis not present

## 2021-11-03 DIAGNOSIS — R5383 Other fatigue: Secondary | ICD-10-CM | POA: Diagnosis not present

## 2021-11-03 DIAGNOSIS — I10 Essential (primary) hypertension: Secondary | ICD-10-CM | POA: Diagnosis not present

## 2021-11-23 DIAGNOSIS — F319 Bipolar disorder, unspecified: Secondary | ICD-10-CM | POA: Diagnosis not present

## 2021-11-23 DIAGNOSIS — Z01419 Encounter for gynecological examination (general) (routine) without abnormal findings: Secondary | ICD-10-CM | POA: Diagnosis not present

## 2022-02-03 DIAGNOSIS — R5383 Other fatigue: Secondary | ICD-10-CM | POA: Diagnosis not present

## 2022-02-22 DIAGNOSIS — Z6831 Body mass index (BMI) 31.0-31.9, adult: Secondary | ICD-10-CM | POA: Diagnosis not present

## 2022-02-22 DIAGNOSIS — F319 Bipolar disorder, unspecified: Secondary | ICD-10-CM | POA: Diagnosis not present

## 2022-02-22 DIAGNOSIS — F5081 Binge eating disorder: Secondary | ICD-10-CM | POA: Diagnosis not present

## 2022-02-22 DIAGNOSIS — G43909 Migraine, unspecified, not intractable, without status migrainosus: Secondary | ICD-10-CM | POA: Diagnosis not present

## 2022-04-12 DIAGNOSIS — L03116 Cellulitis of left lower limb: Secondary | ICD-10-CM | POA: Diagnosis not present

## 2022-04-12 DIAGNOSIS — L0109 Other impetigo: Secondary | ICD-10-CM | POA: Diagnosis not present

## 2022-06-18 DIAGNOSIS — Z6831 Body mass index (BMI) 31.0-31.9, adult: Secondary | ICD-10-CM | POA: Diagnosis not present

## 2022-06-18 DIAGNOSIS — G43909 Migraine, unspecified, not intractable, without status migrainosus: Secondary | ICD-10-CM | POA: Diagnosis not present

## 2022-06-18 DIAGNOSIS — F319 Bipolar disorder, unspecified: Secondary | ICD-10-CM | POA: Diagnosis not present

## 2023-06-14 IMAGING — CR DG HAND 2V*R*
2 series · 2 of 2 positions shown · non-contrast
Comparison: None.

CLINICAL DATA: Swelling of right hand. Swelling along the proximal
interphalangeal joints of the fourth and fifth digits for 1 month.
Pain.

EXAM:
RIGHT HAND - 2 VIEW

[x hand pa right]
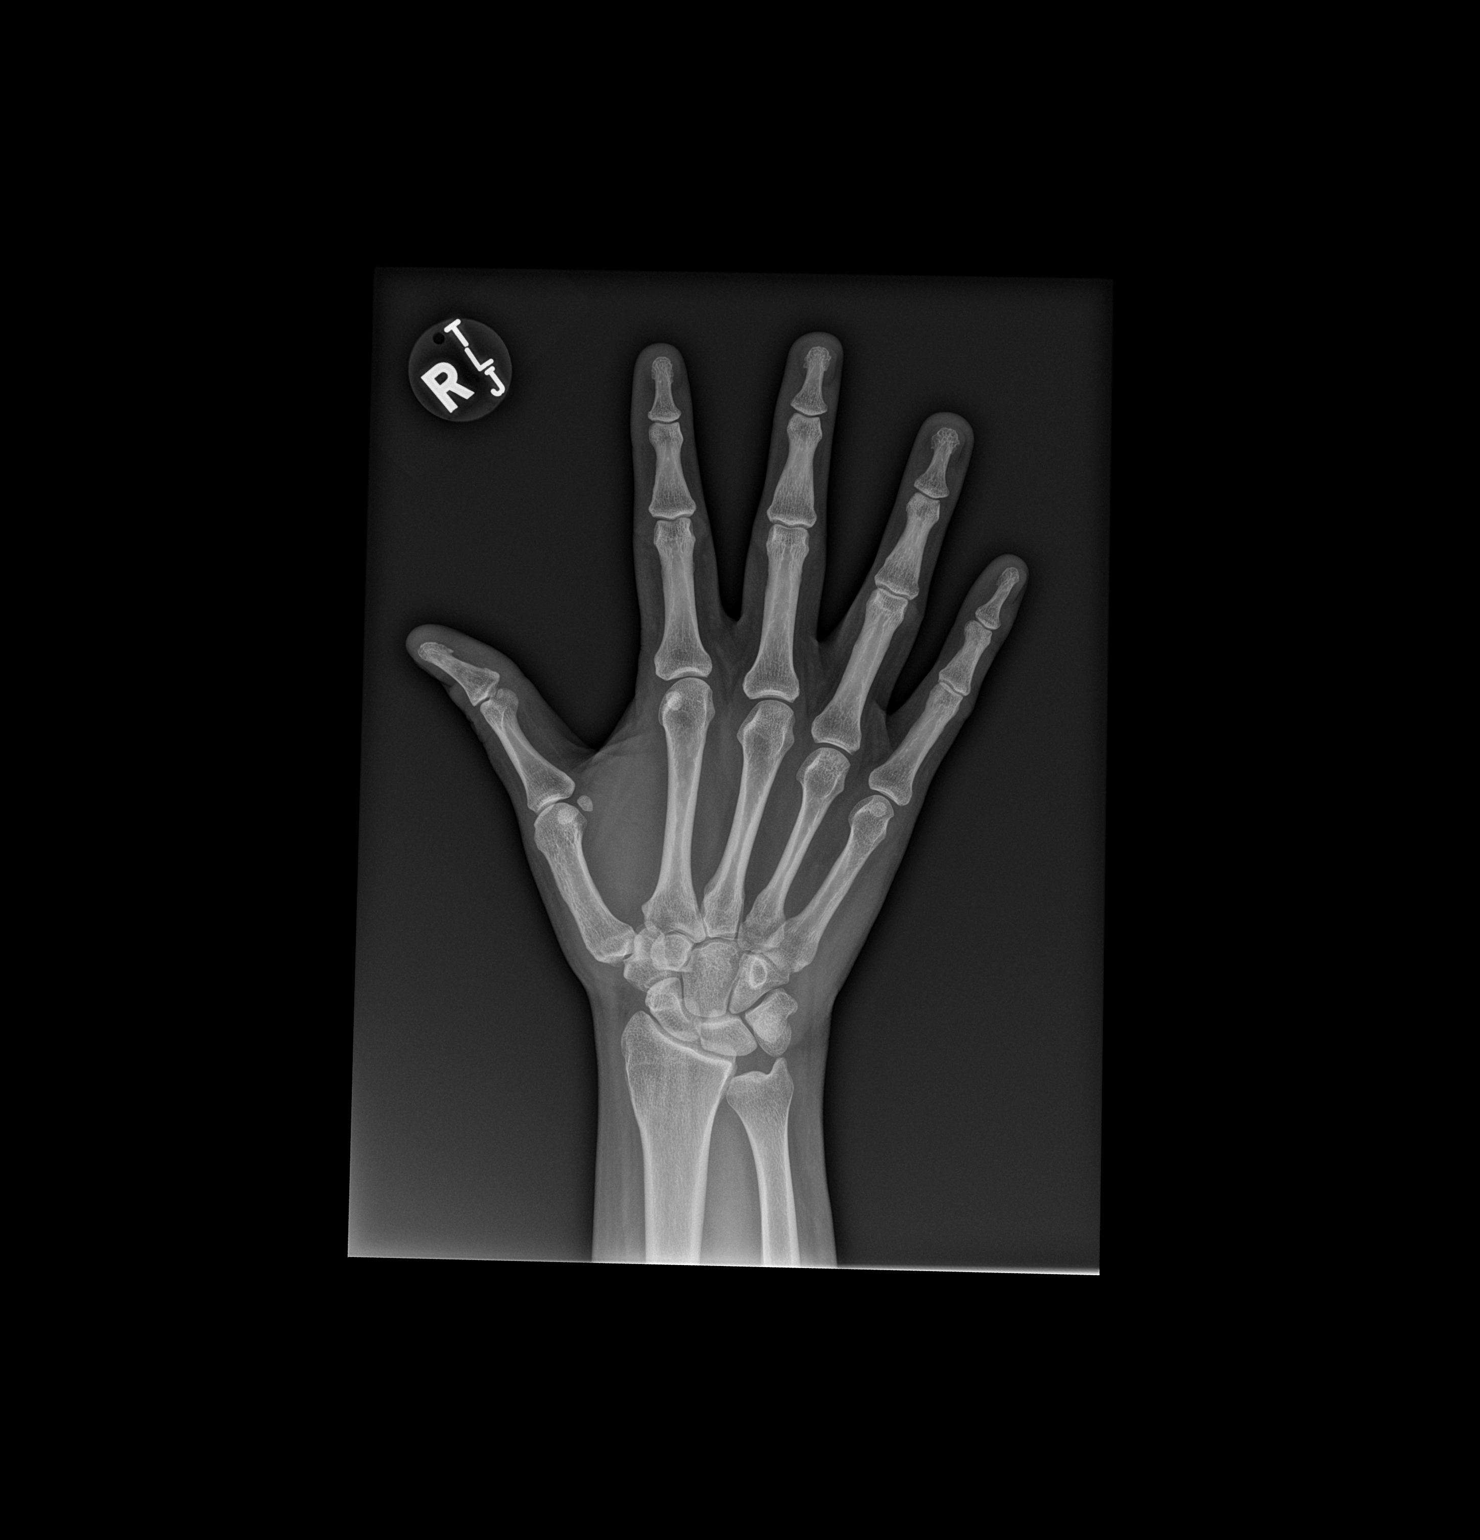

[x hand lat right]
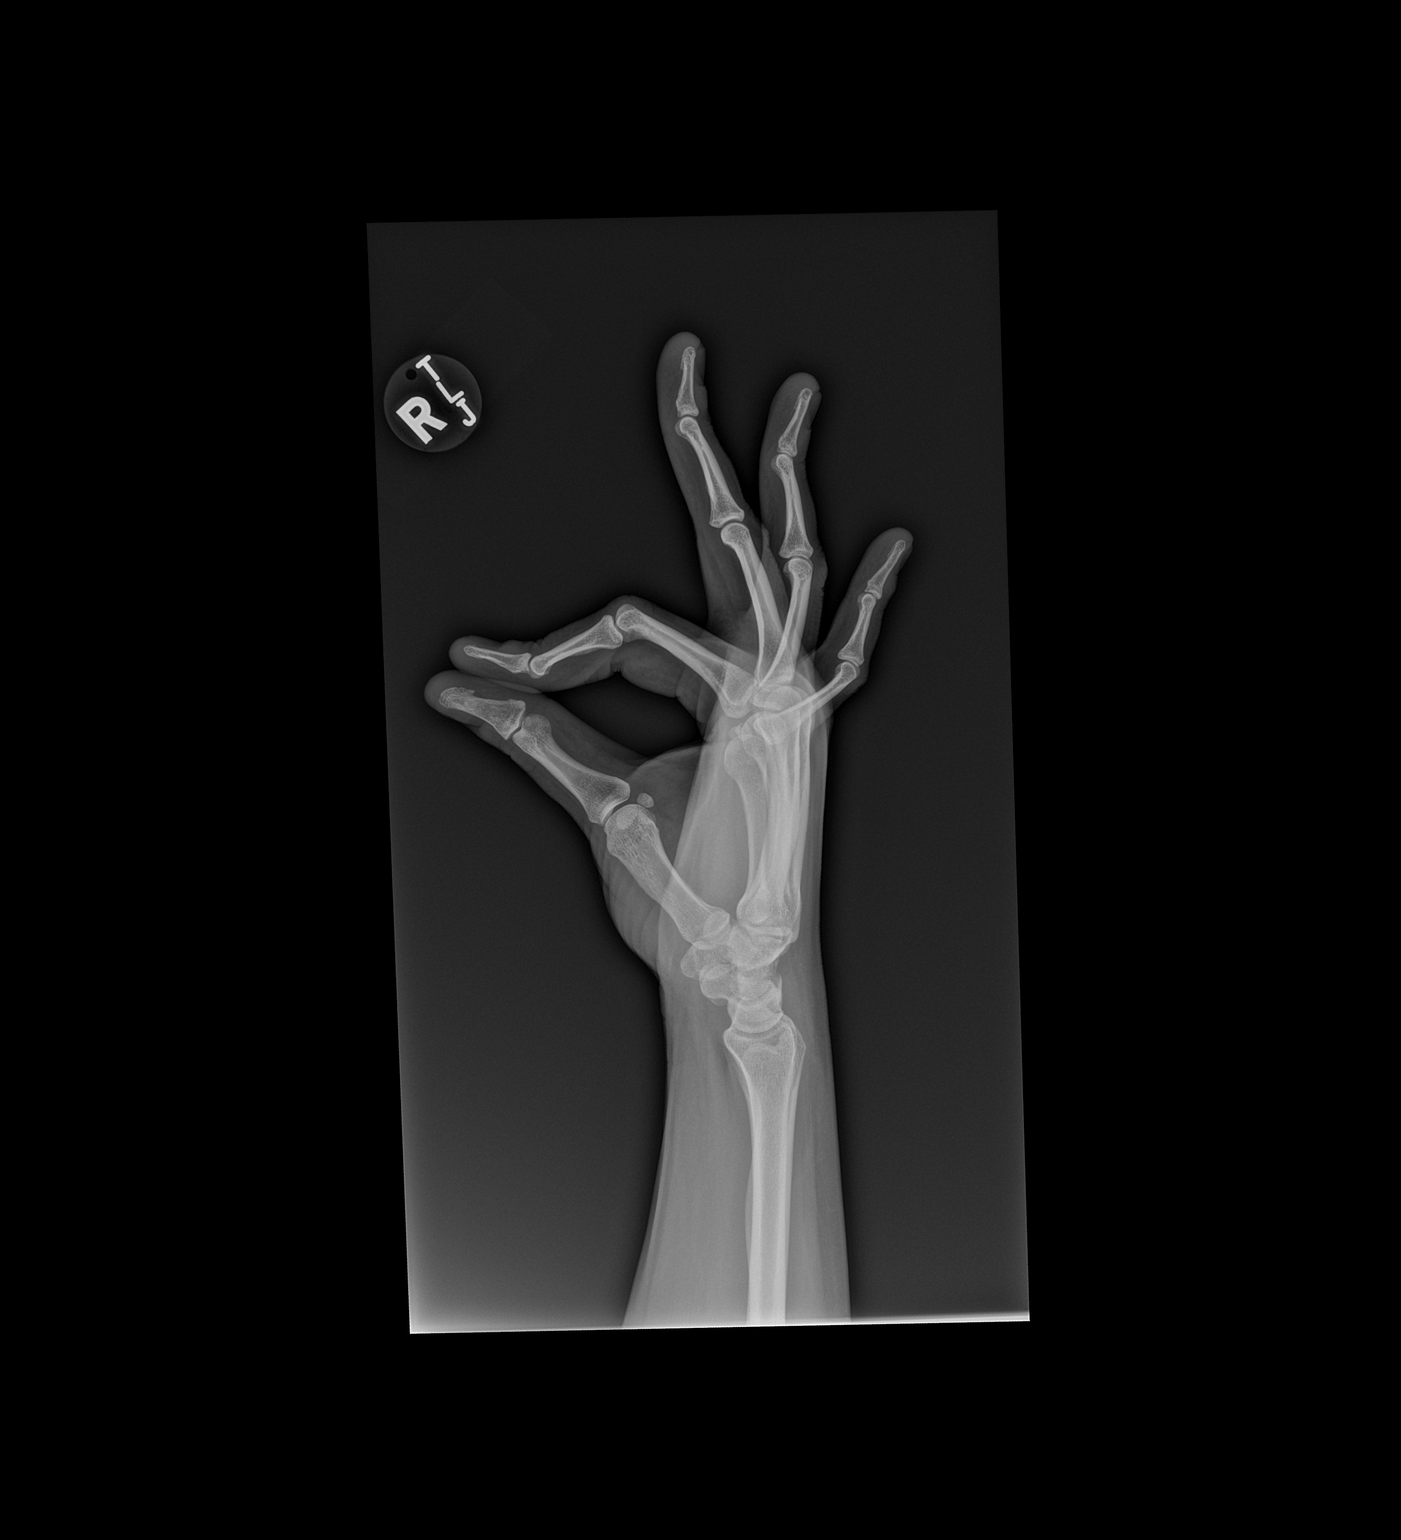

[2 of 2 positions shown; findings below may reference images not displayed]

FINDINGS: Bone mineralization is normal. There is no evidence of fracture or
dislocation. Normal alignment and joint spaces. There is no evidence
of arthropathy or other focal bone abnormality. No erosion. Soft
tissues are unremarkable.
IMPRESSION: Negative radiographs of the right hand.
